# Patient Record
Sex: Female | Born: 2000 | Race: White | Hispanic: No | Marital: Single | Smoking: Never smoker
Health system: Southern US, Community
[De-identification: ages and names within clinical notes are randomized; demographics above are authoritative.]

## PROBLEM LIST (undated history)

## (undated) ENCOUNTER — Inpatient Hospital Stay (HOSPITAL_COMMUNITY): Payer: Self-pay

## (undated) ENCOUNTER — Emergency Department (HOSPITAL_COMMUNITY): Admission: EM | Payer: PRIVATE HEALTH INSURANCE | Source: Home / Self Care

## (undated) DIAGNOSIS — Z9109 Other allergy status, other than to drugs and biological substances: Secondary | ICD-10-CM

## (undated) DIAGNOSIS — L309 Dermatitis, unspecified: Secondary | ICD-10-CM

## (undated) HISTORY — PX: ADENOIDECTOMY: SUR15

## (undated) HISTORY — PX: TONSILLECTOMY: SUR1361

---
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
## 2025-01-08 ENCOUNTER — Inpatient Hospital Stay (HOSPITAL_COMMUNITY)

## 2025-01-08 ENCOUNTER — Encounter (HOSPITAL_COMMUNITY): Payer: Self-pay | Admitting: Obstetrics and Gynecology

## 2025-01-08 ENCOUNTER — Inpatient Hospital Stay (HOSPITAL_COMMUNITY)
Admission: AD | Admit: 2025-01-08 | Discharge: 2025-01-08 | Disposition: A | Discharge status: Home / Self Care | Attending: Obstetrics and Gynecology | Admitting: Obstetrics and Gynecology

## 2025-01-08 DIAGNOSIS — O200 Threatened abortion: Secondary | ICD-10-CM | POA: Diagnosis not present

## 2025-01-08 DIAGNOSIS — R109 Unspecified abdominal pain: Secondary | ICD-10-CM | POA: Diagnosis not present

## 2025-01-08 DIAGNOSIS — Z3A08 8 weeks gestation of pregnancy: Secondary | ICD-10-CM | POA: Insufficient documentation

## 2025-01-08 DIAGNOSIS — O208 Other hemorrhage in early pregnancy: Secondary | ICD-10-CM | POA: Insufficient documentation

## 2025-01-08 DIAGNOSIS — O26891 Other specified pregnancy related conditions, first trimester: Secondary | ICD-10-CM | POA: Diagnosis not present

## 2025-01-08 DIAGNOSIS — O26899 Other specified pregnancy related conditions, unspecified trimester: Secondary | ICD-10-CM

## 2025-01-08 DIAGNOSIS — Z3A09 9 weeks gestation of pregnancy: Secondary | ICD-10-CM | POA: Insufficient documentation

## 2025-01-08 DIAGNOSIS — O209 Hemorrhage in early pregnancy, unspecified: Secondary | ICD-10-CM

## 2025-01-08 DIAGNOSIS — O418X10 Other specified disorders of amniotic fluid and membranes, first trimester, not applicable or unspecified: Secondary | ICD-10-CM

## 2025-01-08 DIAGNOSIS — Z3490 Encounter for supervision of normal pregnancy, unspecified, unspecified trimester: Secondary | ICD-10-CM

## 2025-01-08 LAB — CBC
HCT: 35.3 % — ABNORMAL LOW (ref 36.0–46.0)
Hemoglobin: 12.2 g/dL (ref 12.0–15.0)
MCH: 29.8 pg (ref 26.0–34.0)
MCHC: 34.6 g/dL (ref 30.0–36.0)
MCV: 86.1 fL (ref 80.0–100.0)
Platelets: 187 K/uL (ref 150–400)
RBC: 4.1 MIL/uL (ref 3.87–5.11)
RDW: 12.7 % (ref 11.5–15.5)
WBC: 9 K/uL (ref 4.0–10.5)
nRBC: 0 % (ref 0.0–0.2)

## 2025-01-08 LAB — WET PREP, GENITAL
Clue Cells Wet Prep HPF POC: NONE SEEN
Sperm: NONE SEEN
Trich, Wet Prep: NONE SEEN
WBC, Wet Prep HPF POC: <10
Yeast Wet Prep HPF POC: NONE SEEN

## 2025-01-08 LAB — HCG, QUANTITATIVE, PREGNANCY: hCG, Beta Chain, Quant, S: 134898 m[IU]/mL — ABNORMAL HIGH

## 2025-01-08 LAB — URINALYSIS, ROUTINE W REFLEX MICROSCOPIC
Bilirubin Urine: NEGATIVE
Glucose, UA: NEGATIVE mg/dL
Hgb urine dipstick: NEGATIVE
Ketones, ur: 20 mg/dL — AB
Leukocytes,Ua: NEGATIVE
Nitrite: NEGATIVE
Protein, ur: NEGATIVE mg/dL
Specific Gravity, Urine: 1.024 (ref 1.005–1.030)
pH: 5 (ref 5.0–8.0)

## 2025-01-08 NOTE — MAU Provider Note (Signed)
 " History     CSN: [id number]  Arrival date and time: 01/08/25 1732   Event Date/Time   First Provider Initiated Contact with Patient 01/08/25 2112      Chief Complaint  Patient presents with   Vaginal Bleeding   Abdominal Pain   HPI [name] is a 24 y.o. year old G1P0 female at [redacted]w[redacted]d weeks gestation by LMP who presents to MAU reporting brown spotting since last Wednesday, 01/01/2025.  She reports she had 3 episodes of bleeding since then.  One episode of pinkish bleeding and cramping happened today; pain rated 6/10.  She describes the bleeding today as pinkish, however, it was bright red on Monday.  She only sees a bleeding with wiping and not having to wear a pad.  She is reporting minimal pain now; pain rated 2/10. Her partner is present and contributing to the history taking.    OB History     Gravida  1   Para      Term      Preterm      AB      Living         SAB      IAB      Ectopic      Multiple      Live Births              Past Medical History:  Diagnosis Date   Eczema    Environmental allergies     Past Surgical History:  Procedure Laterality Date   ADENOIDECTOMY     TONSILLECTOMY      History reviewed. No pertinent family history.  Social History[1]  Allergies: Allergies[2]  Medications Prior to Admission  Medication Sig Dispense Refill Last Dose/Taking   Acetaminophen  (MIDOL  PO) Take 1-2 tablets by mouth every 6 (six) hours as needed (for pain, cramps, or discomfort).      bisacodyl (LAXATIVE) 5 MG EC tablet Take 5-10 mg by mouth daily as needed for mild constipation or moderate constipation.      diphenhydrAMINE (BENADRYL) 12.5 MG/5ML liquid Take 12.5-25 mg by mouth 4 (four) times daily as needed for allergies.      fexofenadine (ALLEGRA) 180 MG tablet Take 180 mg by mouth daily as needed for allergies or rhinitis.      HYDROcodone -acetaminophen  (HYCET) 7.5-325 mg/15 ml solution Take 10 mLs by mouth 4 (four) times daily as  needed for moderate pain. 120 mL 0    ibuprofen  (ADVIL ) 600 MG tablet Take 1 tablet (600 mg total) by mouth every 8 (eight) hours as needed. 30 tablet 0    ondansetron  (ZOFRAN  ODT) 4 MG disintegrating tablet Take 1 tablet (4 mg total) by mouth every 8 (eight) hours as needed for nausea or vomiting. 20 tablet 0     Review of Systems  Constitutional: Negative.   HENT: Negative.    Eyes: Negative.   Respiratory: Negative.    Cardiovascular: Negative.   Gastrointestinal: Negative.   Endocrine: Negative.   Genitourinary:  Positive for pelvic pain (cramping; mild) and vaginal bleeding (spotting).  Musculoskeletal: Negative.   Skin: Negative.   Allergic/Immunologic: Negative.   Neurological: Negative.   Hematological: Negative.   Psychiatric/Behavioral: Negative.     Physical Exam   Blood pressure 119/67, pulse 73, temperature 98.6 F (37 C), resp. rate 18, height 5' 6 (1.676 m), weight 87.5 kg, last menstrual period 10/31/2024.  Physical Exam  MAU Course  Procedures  MDM CCUA UPT CBC ABO/Rh HCG Wet Prep  GC/CT -- Results pending  RPR -- Results pending  OB U/S < 14 wks TVUS  Results for orders placed or performed during the hospital encounter of 01/08/25 (from the past 48 hours)  GC/Chlamydia probe amp ([place])not at [facility]     Status: None   Collection Time: 01/08/25  6:11 PM  Result Value Ref Range   Neisseria Gonorrhea Negative    Chlamydia Negative    Comment Normal Reference Ranger Chlamydia - Negative    Comment      Normal Reference Range Neisseria Gonorrhea - Negative  CBC     Status: Abnormal   Collection Time: 01/08/25  6:19 PM  Result Value Ref Range   WBC 9.0 4.0 - 10.5 K/uL   RBC 4.10 3.87 - 5.11 MIL/uL   Hemoglobin 12.2 12.0 - 15.0 g/dL   HCT 64.6 (L) 63.9 - 53.9 %   MCV 86.1 80.0 - 100.0 fL   MCH 29.8 26.0 - 34.0 pg   MCHC 34.6 30.0 - 36.0 g/dL   RDW 87.2 88.4 - 84.4 %   Platelets 187 150 - 400 K/uL   nRBC 0.0 0.0 - 0.2 %    Comment:  Performed at [facility], [address], [place], [place] [address]  ABO/Rh     Status: None   Collection Time: 01/08/25  6:19 PM  Result Value Ref Range   ABO/RH(D) O POS    No rh immune globuloin      NOT A RH IMMUNE GLOBULIN CANDIDATE, PT RH POSITIVE Performed at [facility], [address] [place], [place] [address]   hCG, quantitative, pregnancy     Status: Abnormal   Collection Time: 01/08/25  6:19 PM  Result Value Ref Range   hCG, Beta Chain, Quant, S 134,898 (H) <5 mIU/mL    Comment:          GEST. AGE      CONC.  (mIU/mL)   <=1 WEEK        5 - 50     2 WEEKS       50 - 500     3 WEEKS       100 - 10,000     4 WEEKS     1,000 - 30,000     5 WEEKS     3,500 - 115,000   6-8 WEEKS     12,000 - 270,000    12 WEEKS     15,000 - 220,000        FEMALE AND NON-PREGNANT FEMALE:     LESS THAN 5 mIU/mL Performed at [facility], [address], [place], [place] [address]   Wet prep, genital     Status: None   Collection Time: 01/08/25  6:36 PM   Specimen: PATH Cytology Cervicovaginal Ancillary Only  Result Value Ref Range   Yeast Wet Prep HPF POC NONE SEEN NONE SEEN   Trich, Wet Prep NONE SEEN NONE SEEN   Clue Cells Wet Prep HPF POC NONE SEEN NONE SEEN   WBC, Wet Prep HPF POC <10 <10   Sperm NONE SEEN     Comment: Performed at [facility], [address] [place], [place] [address]  Urinalysis, Routine w reflex microscopic -Urine, Clean Catch     Status: Abnormal   Collection Time: 01/08/25  6:36 PM  Result Value Ref Range   Color, Urine YELLOW YELLOW   APPearance HAZY (A) CLEAR   Specific Gravity,  Urine 1.024 1.005 - 1.030   pH 5.0 5.0 - 8.0   Glucose, UA NEGATIVE NEGATIVE mg/dL   Hgb urine dipstick NEGATIVE NEGATIVE   Bilirubin Urine NEGATIVE NEGATIVE   Ketones, ur 20 (A) NEGATIVE mg/dL   Protein, ur NEGATIVE NEGATIVE mg/dL   Nitrite NEGATIVE NEGATIVE   Leukocytes,Ua NEGATIVE NEGATIVE    Comment: Performed at [facility], [address], [place], [place] [address]   US  MAINE Comp Less 14 Wks Result Date: 01/08/2025 EXAM: OBSTETRIC ULTRASOUND FIRST TRIMESTER TECHNIQUE: Transabdominal and Transvaginal first trimester obstetric pelvic duplex ultrasound was performed with real-time imaging and color flow Doppler imaging. Waveform analysis was not performed for the purposes of this exam. COMPARISON: None available. CLINICAL HISTORY: Positive pregnancy test with vaginal bleeding. FINDINGS: UTERUS: No focal myometrial mass. GESTATIONAL SAC(S): Single intrauterine gestational sac is noted. Curvilinear subchorionic hemorrhage is noted, measuring up to 5 mm in thickness. YOLK SAC: Yolk sac is identified. EMBRYO(<11WK) /FETUS(>=11WK): Embryo is seen with fetal cardiac activity. CROWN RUMP LENGTH: Crown-rump length measures 19.2 mm, corresponding to a gestational age of [redacted] weeks 3 days. RATE OF CARDIAC ACTIVITY: The heart rate is 167 beats per minute. RIGHT OVARY: The right ovary is within normal limits with normal color flow. Waveform analysis was not performed for the purposes of this exam. LEFT OVARY: The left ovary is within normal limits with normal color flow. Waveform analysis was not performed for the purposes of this exam. FREE FLUID: No free fluid. MEASUREMENTS ESTIMATED GESTATIONAL AGE BY CURRENT ULTRASOUND: 8 weeks 3 days ESTIMATED DUE DATE: 08/17/2025 IMPRESSION: 1. Intrauterine pregnancy with embryonic/fetal cardiac activity, gestational age [redacted] weeks 3 days, and estimated date of confinement 08/17/2025. 2. Curvilinear subchorionic hemorrhage measuring up to 5 mm in thickness. Electronically signed by: Oneil Devonshire MD 01/08/2025 07:38 PM EST RP Workstation: [id number]       Assessment and Plan  1. Intrauterine pregnancy (Primary) - GA changed d/t >1 week difference from U/S and LMP dating - Start [facility] Provider list provided  2. Subchorionic hematoma in first trimester, single or unspecified fetus - Information provided on  [facility]   3. Threatened miscarriage in early pregnancy - Information provided on threatened miscarriage  4. Vaginal bleeding affecting early pregnancy - Information provided on vaginal bleeding in pregnancy   5. Abdominal pain affecting pregnancy - Information provided on abdominal pain in pregnancy   6. [redacted] weeks gestation of pregnancy   - Discharge home - Establish [facility] with OB of choice - Patient verbalized an understanding of the plan of care and agrees.   Ala Cart, CNM 01/08/2025, 9:12 PM      [1]  Social History Tobacco Use   Smoking status: Never  Substance Use Topics   Alcohol use: No   Drug use: No  [2]  Allergies Allergen Reactions   Morphine  And Codeine Nausea Only and Other (See Comments)    I don't like how it made me feel   Tape Other (See Comments)    IV tubing- skin is sensitive to this- caused redness   Penicillins Rash    DID THE REACTION INVOLVE: Swelling of the face/tongue/throat, SOB, or low BP? Yes Sudden or severe rash/hives, skin peeling, or the inside of the mouth or nose? Yes Did it require medical treatment? Yes When did it last happen? Approx 8 years ago  If all above answers are NO, may proceed with cephalosporin use.    Sulfa Antibiotics Rash   "

## 2025-01-08 NOTE — MAU Note (Signed)
 Donna Meyers is a 24 y.o. at Unknown here in MAU reporting: started having brown spotting Last Thursday.  Has had 3 more episodes of red bleeding since then. Today she had another one but had a lot of cramping with it 6/10. Bleeding only when she wipes not enough to wear a pad. Pain in minimal now  LMP:  Onset of complaint: Thursday Pain score: 2 Vitals:   01/08/25 1855  BP: 119/67  Pulse: 73  Resp: 18  Temp: 98.6 F (37 C)     FHT: n/a   Lab orders placed from triage: u/a, Wet prp gc

## 2025-01-08 NOTE — Discharge Instructions (Signed)
 Return to MAU: If you have heavier bleeding that soaks through more that 2 pads per hour for an hour or more If you bleed so much that you feel like you might pass out or you do pass out If you have significant abdominal pain that is not improved with Tylenol 1000 mg every 8 hours as needed for pain If you develop a fever > 100.5    [employer] for [employer] at [employer] [facility]             [address] [place], [place] [address] [phone]  Center for [employer] at [facility]                                                             [address], [place], [place], [address] [phone]  Center for [facility] at [facility] [address], [place], [place], [address] [phone]  Center for [facility] at [facility] [address], [place], [place], [address] [phone]  Center for [facility] at [facility]                                 [address] [place], [place], [place], [address] [phone]  Center for [facility] at [facility]                                    [address], [place], [place], [address] [phone]  Center for [facility] at [facility] [address], [place], [place], [address]                              [facility] [place] [address], [place], [place], [address] [phone]  [facility]         Phone: [phone]  [facility]      Phone: [phone]   [facility]      Phone: [phone]  [facility]-Family [facility]         Phone: [phone]   [facility] Department-Maternity    Phone: [phone]  [facility]      Phone: [phone]  [facility] [place]     Phone: [phone]  [facility]        Phone: [phone]  [facility] ([address] [place]) [phone]  [facility]      Phone: [phone]

## 2025-01-09 LAB — GC/CHLAMYDIA PROBE AMP (~~LOC~~) NOT AT ARMC
Chlamydia: NEGATIVE
Comment: NEGATIVE
Comment: NORMAL
Neisseria Gonorrhea: NEGATIVE

## 2025-01-09 LAB — ABO/RH: ABO/RH(D): O POS
